# Patient Record
Sex: Male | Born: 1970 | Race: Black or African American | Hispanic: No | Marital: Single
Health system: Southern US, Community
[De-identification: ages and names within clinical notes are randomized; demographics above are authoritative.]

---

## 2018-12-09 DEATH — deceased

## 2019-11-04 ENCOUNTER — Other Ambulatory Visit: Payer: Self-pay

## 2019-11-04 ENCOUNTER — Emergency Department (HOSPITAL_COMMUNITY)
Admission: EM | Admit: 2019-11-04 | Discharge: 2019-11-04 | Disposition: A | Discharge status: Home / Self Care | Payer: Self-pay | Attending: Emergency Medicine | Admitting: Emergency Medicine

## 2019-11-04 ENCOUNTER — Emergency Department (HOSPITAL_COMMUNITY): Payer: Self-pay

## 2019-11-04 ENCOUNTER — Encounter (HOSPITAL_COMMUNITY): Payer: Self-pay | Admitting: Emergency Medicine

## 2019-11-04 DIAGNOSIS — Y929 Unspecified place or not applicable: Secondary | ICD-10-CM | POA: Insufficient documentation

## 2019-11-04 DIAGNOSIS — Y990 Civilian activity done for income or pay: Secondary | ICD-10-CM | POA: Insufficient documentation

## 2019-11-04 DIAGNOSIS — Y9389 Activity, other specified: Secondary | ICD-10-CM | POA: Insufficient documentation

## 2019-11-04 DIAGNOSIS — W231XXA Caught, crushed, jammed, or pinched between stationary objects, initial encounter: Secondary | ICD-10-CM | POA: Insufficient documentation

## 2019-11-04 DIAGNOSIS — S93601A Unspecified sprain of right foot, initial encounter: Secondary | ICD-10-CM | POA: Insufficient documentation

## 2019-11-04 DIAGNOSIS — S93401A Sprain of unspecified ligament of right ankle, initial encounter: Secondary | ICD-10-CM | POA: Insufficient documentation

## 2019-11-04 MED ORDER — IBUPROFEN 600 MG PO TABS: 600.0000 mg | ORAL_TABLET | Freq: Four times a day (QID) | ORAL | 0 refills | Status: AC | PRN

## 2019-11-04 MED ORDER — IBUPROFEN 200 MG PO TABS
600.0000 mg | ORAL_TABLET | Freq: Once | ORAL | Status: AC
  Administered 2019-11-04: 600 mg via ORAL
  Filled 2019-11-04: qty 3

## 2019-11-04 NOTE — Discharge Instructions (Addendum)
Crutches and Ace wrap, elevate the foot and ankle as much as possible apply ice.  Use ibuprofen 600 mg every 6 hours, Tylenol 1000 mg every 6 hours to treat pain.  As pain improves you can bear weight as tolerated.  If symptoms are not improving please follow-up with orthopedics.

## 2019-11-04 NOTE — ED Provider Notes (Signed)
[facility] DEPT Provider Note   CSN: [id number] Arrival date & time: 11/04/19  3710     History Chief Complaint  Patient presents with  . Foot Injury    Ryan Bond is a 49 y.o. male.  Ryan Bond is a 49 y.o. male with hx of TBI & bipolar disorder, who presents with injury to the fight foot and ankle. Yesterday while at work he was caryying a heavy platform and fell backwards and his foot got caught underneath him.  He states he has had some increasing swelling and pain of the foot and ankle since then has had difficulty bearing weight on the foot.  Constant dull ache.  He denies any bruising or discoloration.  No numbness or tingling.  He has not taken anything for the pain prior to arrival, was not sure what he could take with his psychiatric medications.  He reports 21 years ago he had significant ligamentous injury to the foot and was in rehab for over a year but has since recovered well, no more recent injuries.  The history is provided by the patient.       History reviewed. No pertinent past medical history.  There are no problems to display for this patient.   History reviewed. No pertinent surgical history.     No family history on file.  Social History   Tobacco Use  . Smoking status: Not on file  Substance Use Topics  . Alcohol use: Not on file  . Drug use: Not on file    Home Medications Prior to Admission medications   Not on File    Allergies    Morphine and related  Review of Systems   Review of Systems  Constitutional: Negative for chills and fever.  Musculoskeletal: Positive for arthralgias and joint swelling.  Skin: Negative for color change and rash.  Neurological: Negative for weakness and numbness.    Physical Exam Updated Vital Signs BP (!) 158/86 (BP Location: Left Arm)   Pulse (!) 103   Temp 98.4 F (36.9 C) (Oral)   Resp 18   SpO2 96%   Physical Exam Vitals and nursing note reviewed.    Constitutional:      General: He is not in acute distress.    Appearance: Normal appearance. He is well-developed and normal weight. He is not diaphoretic.  HENT:     Head: Normocephalic and atraumatic.  Eyes:     General:        Right eye: No discharge.        Left eye: No discharge.  Pulmonary:     Effort: Pulmonary effort is normal. No respiratory distress.  Musculoskeletal:     Comments: Swelling and tenderness over the lateral portion of the right foot bilaterally malleolus as well as mild tenderness over the bottom of the right foot, swelling is minimal, there is no ecchymoses or obvious deformity.  Patient is able to with toes.  Range of motion with plantar and dorsiflexion is limited due to pain.  Sensation intact top of foot.  2+ DP and PT pulses.  Good capillary refill.  Skin:    General: Skin is warm and dry.  Neurological:     Mental Status: He is alert and oriented to person, place, and time.     Coordination: Coordination normal.  Psychiatric:        Mood and Affect: Mood normal.        Behavior: Behavior normal.     ED  Results / Procedures / Treatments   Labs (all labs ordered are listed, but only abnormal results are displayed) Labs Reviewed - No data to display  EKG None  Radiology DG Ankle Complete Right  Result Date: 11/04/2019 CLINICAL DATA:  Patient fell backward last night twisting is right foot and ankle. Complaining of pain. EXAM: RIGHT ANKLE - COMPLETE 3+ VIEW COMPARISON:  None. FINDINGS: There is no evidence of fracture, dislocation, or joint effusion. There is no evidence of arthropathy or other focal bone abnormality. Soft tissues are unremarkable. IMPRESSION: Negative. Electronically Signed   By: Amie Portland M.D.   On: 11/04/2019 10:33   DG Foot Complete Right  Result Date: 11/04/2019 CLINICAL DATA:  Patient fell backward last night twisting his right foot and ankle. Complaining of pain. EXAM: RIGHT FOOT COMPLETE - 3+ VIEW COMPARISON:  None.  FINDINGS: There is no evidence of fracture or dislocation. There is no evidence of arthropathy or other focal bone abnormality. Soft tissues are unremarkable. IMPRESSION: Negative. Electronically Signed   By: Amie Portland M.D.   On: 11/04/2019 10:32    Procedures Procedures (including critical care time)  Medications Ordered in ED Medications  ibuprofen (ADVIL) tablet 600 mg (600 mg Oral Given 11/04/19 1119)    ED Course  I have reviewed the triage vital signs and the nursing notes.  Pertinent labs & imaging results that were available during my care of the patient were reviewed by me and considered in my medical decision making (see chart for details).    MDM Rules/Calculators/A&P                      Presents with right foot and ankle pain after a fall injury, no obvious deformity presentation suggests a sprain of the ankle and foot.. Tenderness and swelling over lateral malleolus and lateral right foot, pt is neurovascularly intact, and x-ray negative for fracture, and shows ankle mortise is intact, no evidence of fracture or abnormality in the foot.. Pain treated in the ED. Pt placed in emergency and provided crutches, ambulated without difficulty. Pt stable for discharge home with ibuprofen and tylenol for pain. Pt to follow-up with ortho in one week if symptoms not improving. Return precautions discussed, Pt expresses understanding and agrees with plan.  Final Clinical Impression(s) / ED Diagnoses Final diagnoses:  Sprain of right ankle, unspecified ligament, initial encounter  Sprain of right foot, initial encounter    Rx / DC Orders ED Discharge Orders         Ordered    ibuprofen (ADVIL) 600 MG tablet  Every 6 hours PRN     11/04/19 1136           Dartha Lodge, [place] 11/04/19 1137    Margarita Grizzle, MD 11/04/19 [phone]

## 2019-11-04 NOTE — ED Triage Notes (Signed)
Pt was carrying a platform at work last night when he fell backswords and right foot twisted up underneath him and he landed on it. Reports pain in right foot and ankle

## 2021-07-29 IMAGING — CR DG ANKLE COMPLETE 3+V*R*
3 series · 3 of 3 positions shown · non-contrast
Comparison: None.

CLINICAL DATA: Patient fell backward last night twisting is right
foot and ankle. Complaining of pain.

EXAM:
RIGHT ANKLE - COMPLETE 3+ VIEW

[x ankle lat right]
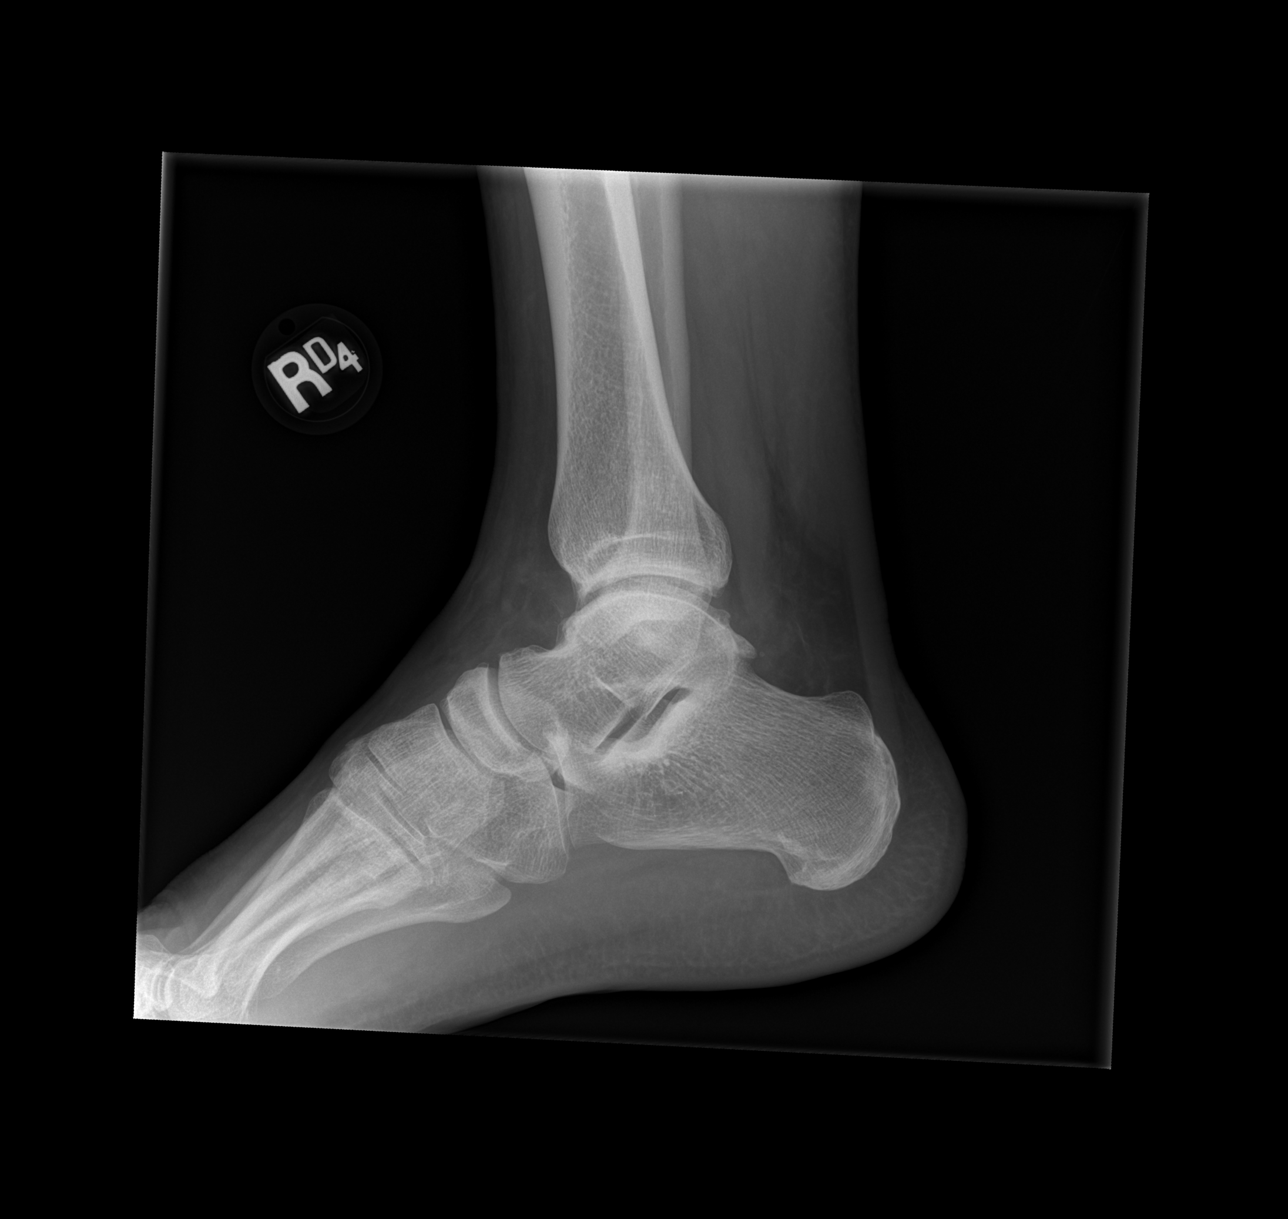

[x ankle ap right]
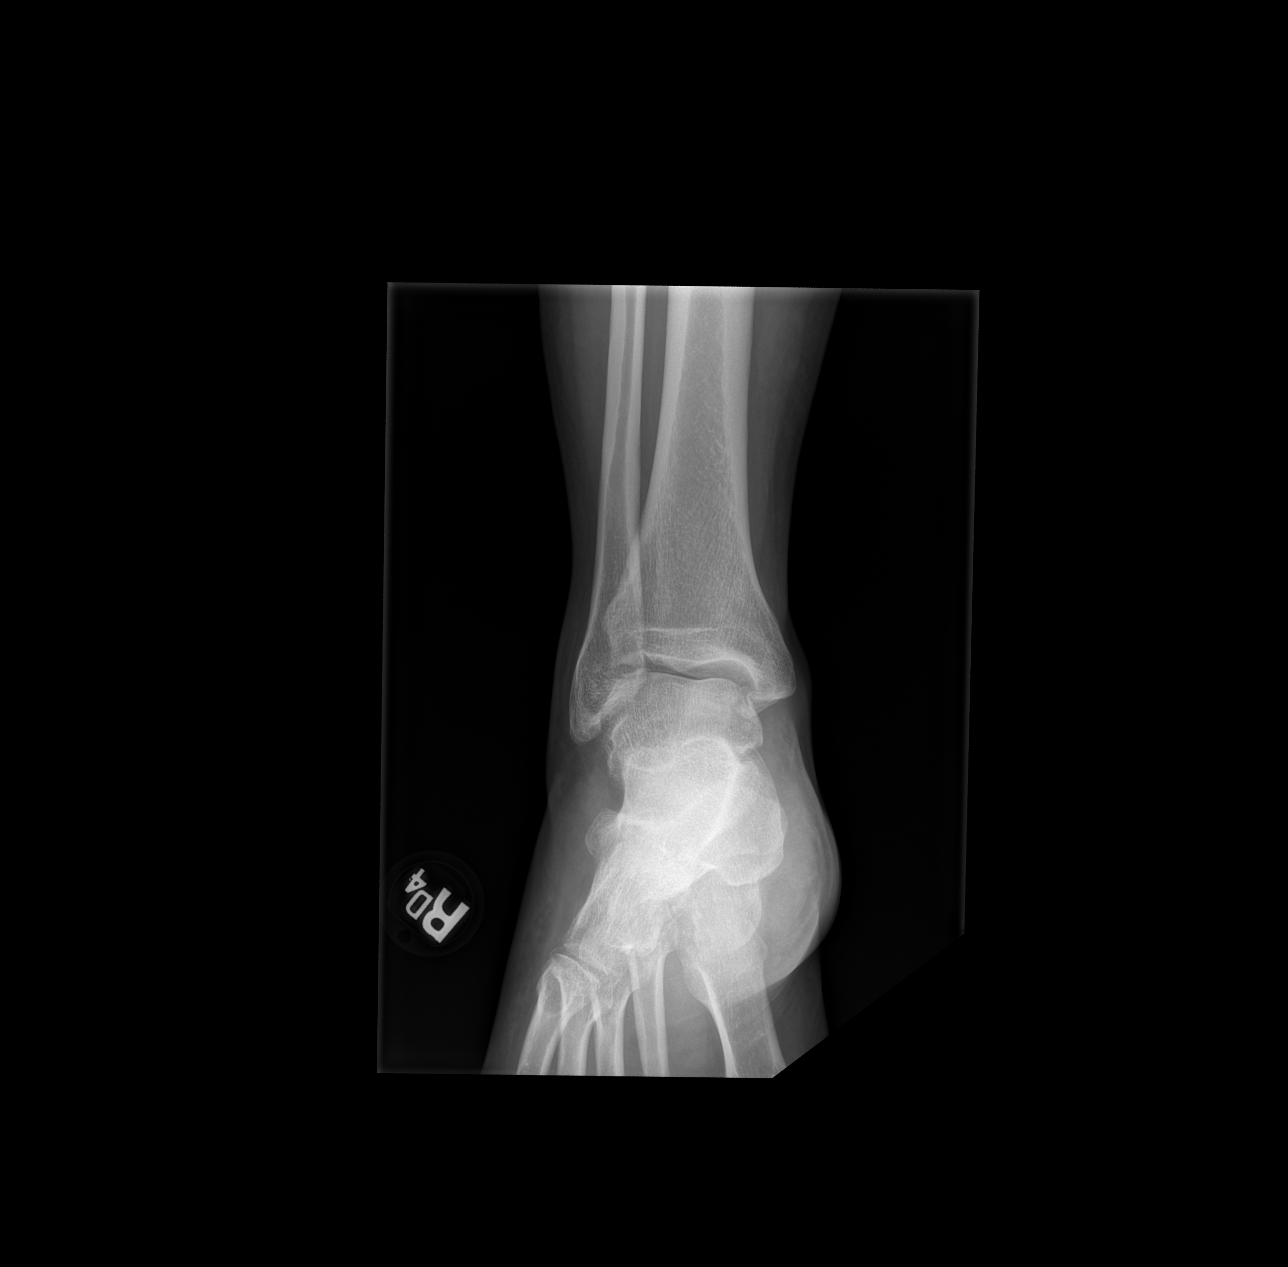

[x ankle obl right]
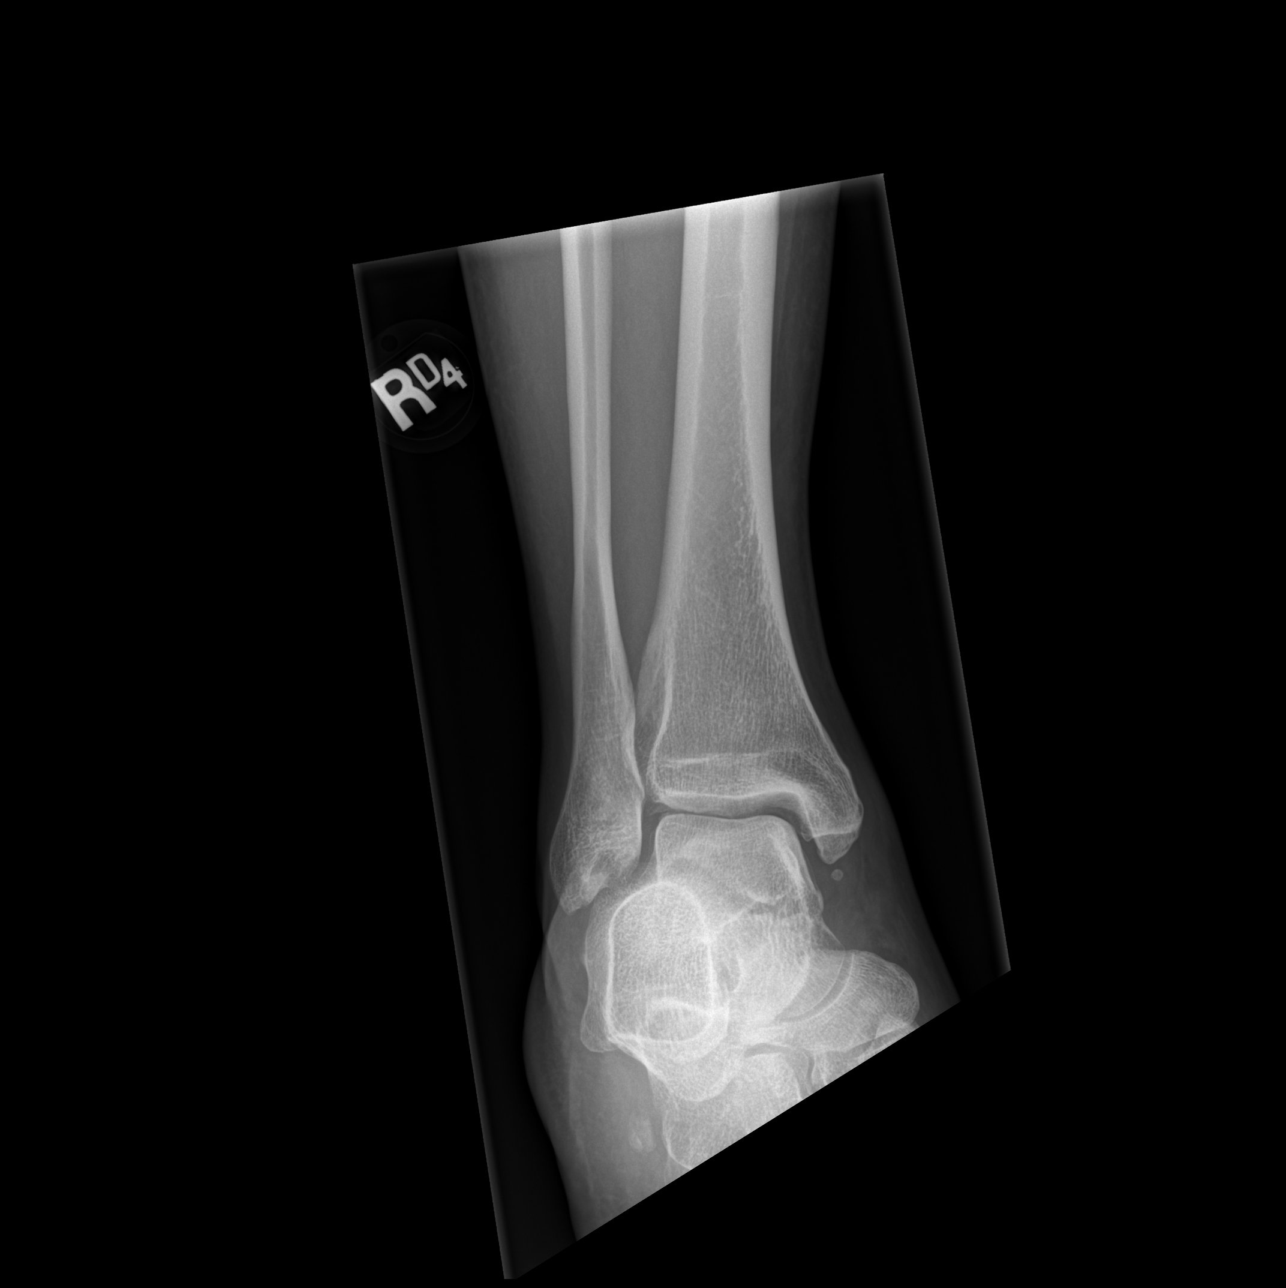

[3 of 3 positions shown; findings below may reference images not displayed]

FINDINGS: There is no evidence of fracture, dislocation, or joint effusion.
There is no evidence of arthropathy or other focal bone abnormality.
Soft tissues are unremarkable.
IMPRESSION: Negative.
